# Patient Record
Sex: Female | Born: 1990 | Race: White | Hispanic: No | Marital: Single | Smoking: Never smoker
Health system: Southern US, Community
[De-identification: ages and names within clinical notes are randomized; demographics above are authoritative.]

---
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
## 2008-02-05 HISTORY — PX: OTHER SURGICAL HISTORY: SHX169

## 2009-03-05 IMAGING — CT CT ABDOMEN W/O CM
2 of 4 series · 17 of 46 positions shown, 19 images · non-contrast
Comparison: None

CT ABDOMEN

CLINICAL DATA: Right kidney stone

CT ABDOMEN AND PELVIS WITHOUT CONTRAST
TECHNIQUE: Multidetector CT imaging of the abdomen and pelvis was
performed following the standard
protocol without intravenous contrast.

[Series 2: stone <(id) w/o a & p (id) · axial · non-contrast · 0.63mm/px · z∈[-424,-54]mm · 14 of 82 slices shown, 16 images]
[im 4/82  soft-tissue]
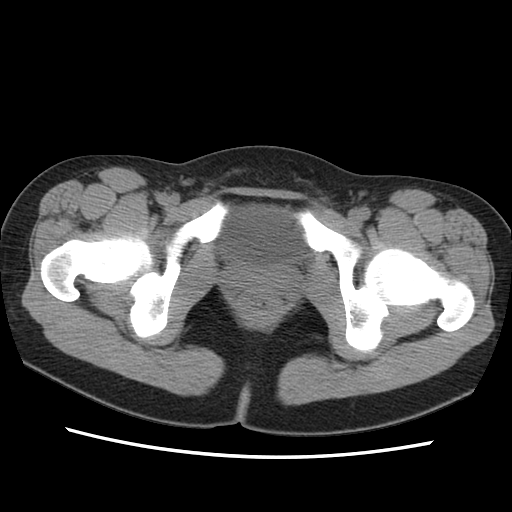
[im 4/82  bone]
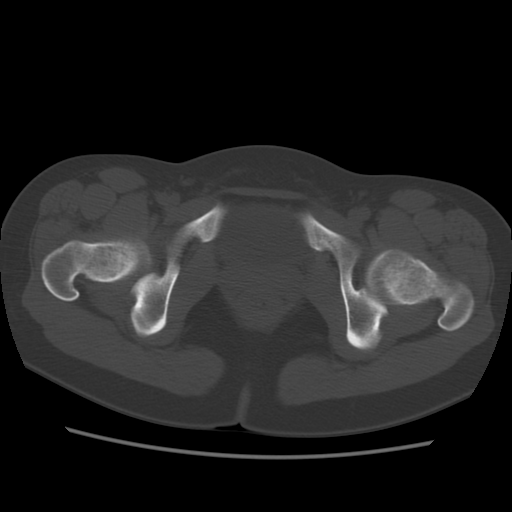
[im 11/82  soft-tissue]
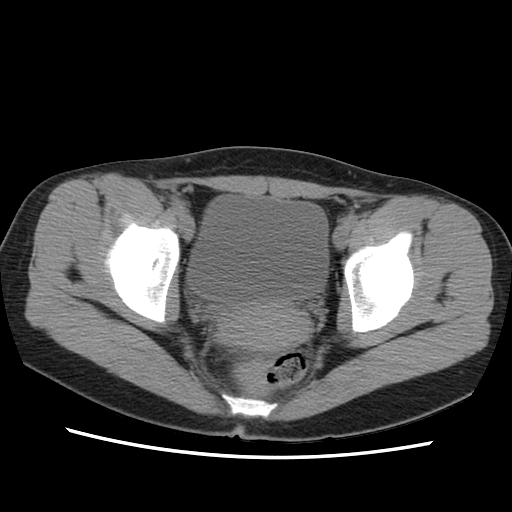
[im 17/82  soft-tissue]
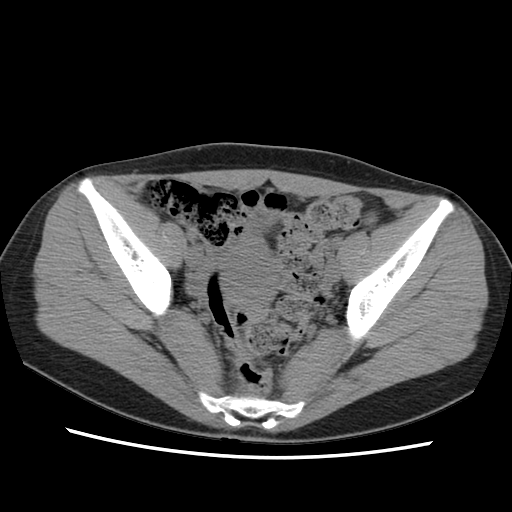
[im 21/82  soft-tissue]
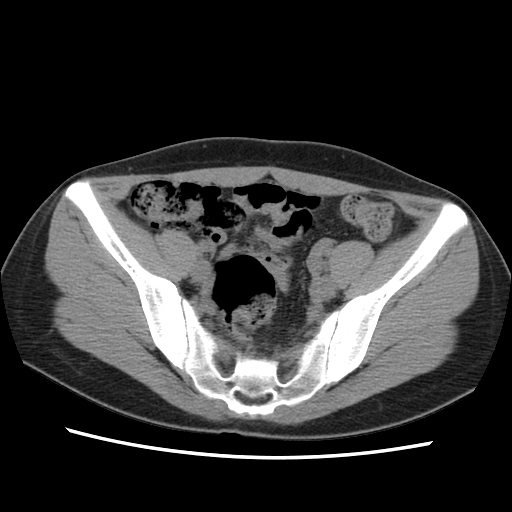
[im 28/82  soft-tissue]
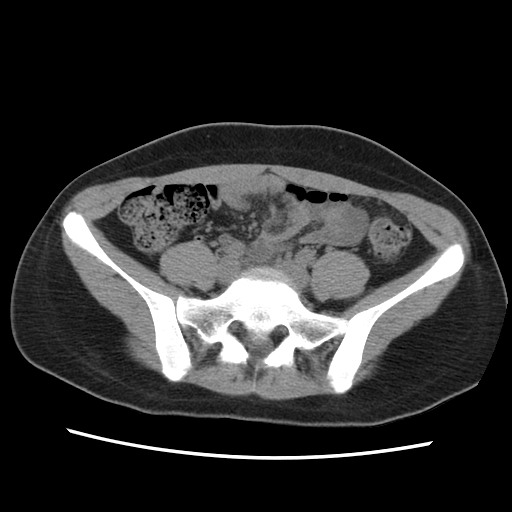
[im 34/82  soft-tissue]
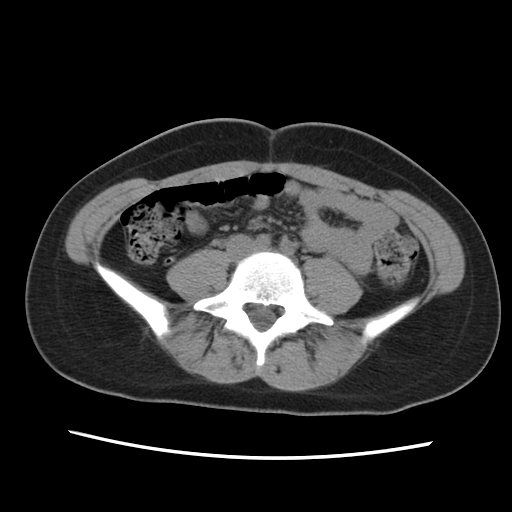
[im 38/82  soft-tissue]
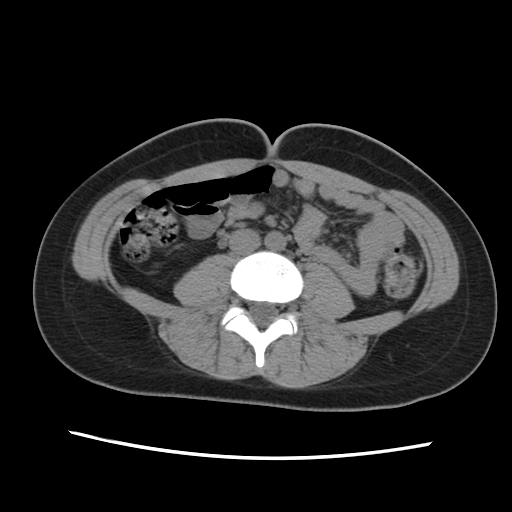
[im 44/82  soft-tissue]
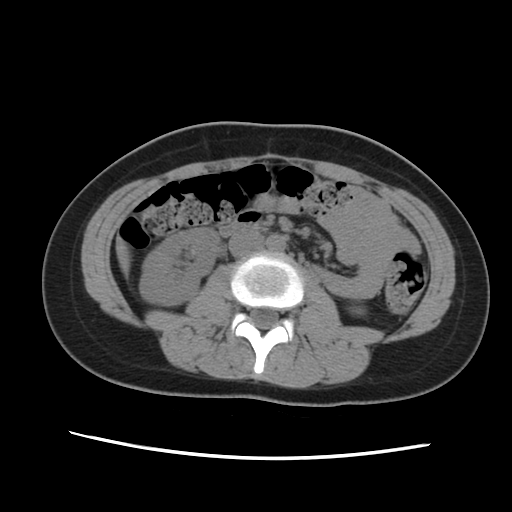
[im 48/82  soft-tissue]
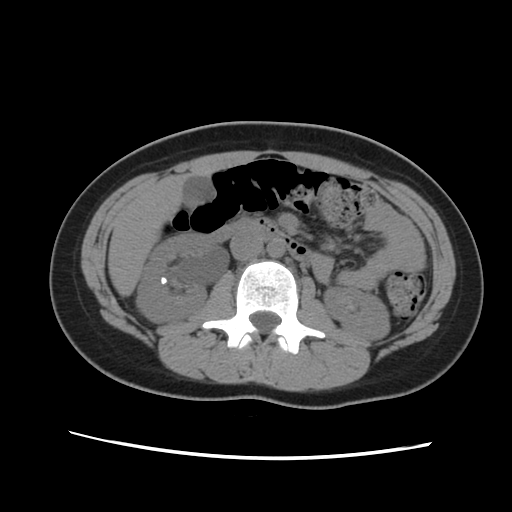
[im 48/82  bone]
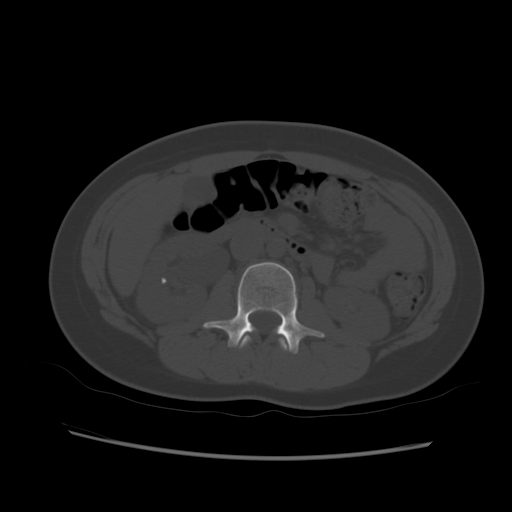
[im 55/82  soft-tissue]
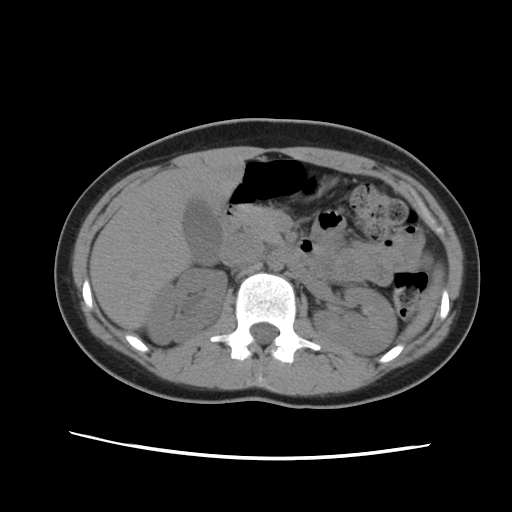
[im 61/82  soft-tissue]
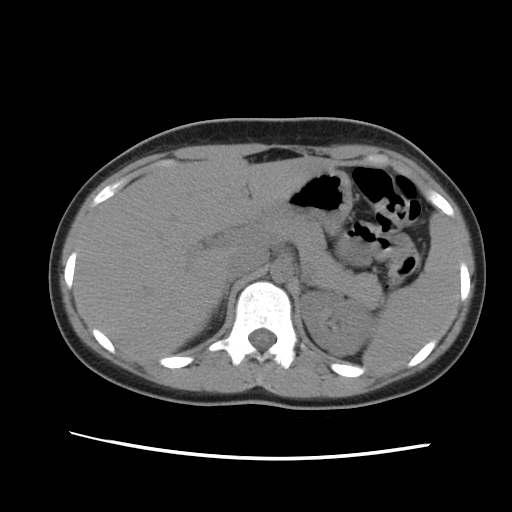
[im 65/82  soft-tissue]
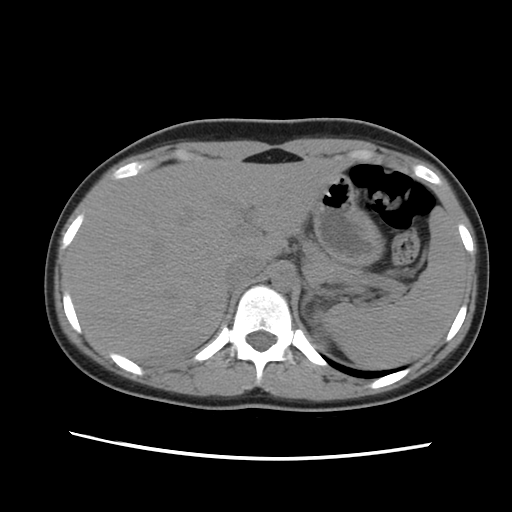
[im 71/82  soft-tissue]
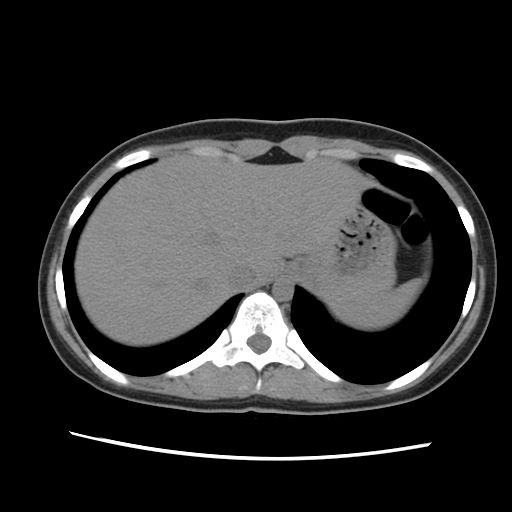
[im 78/82  soft-tissue]
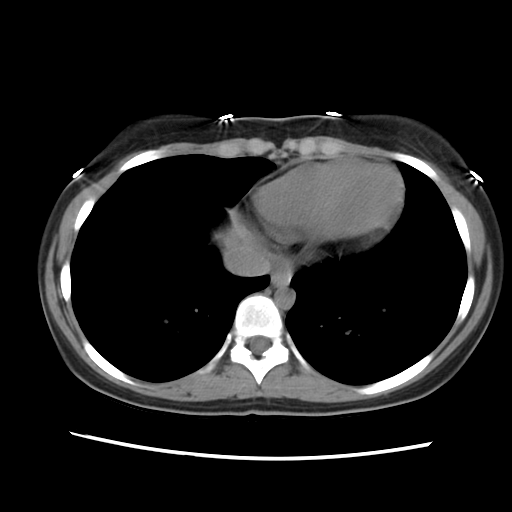

[Series 401: reformatted · coronal · 0.89mm/px · 3 of 55 slices shown]
[im 19/55  soft-tissue]
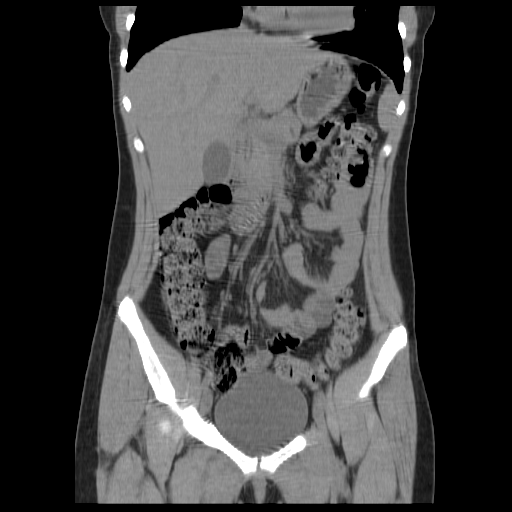
[im 25/55  soft-tissue]
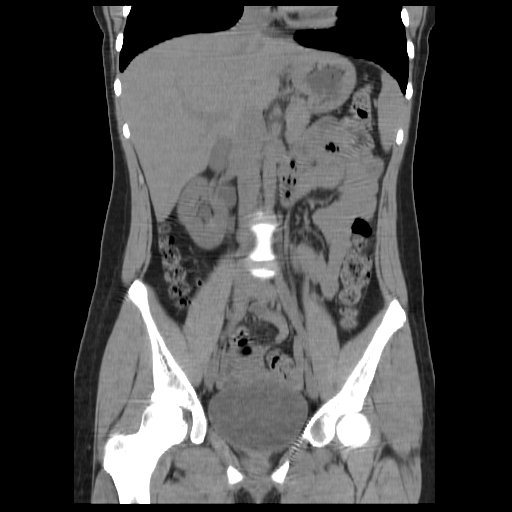
[im 31/55  soft-tissue]
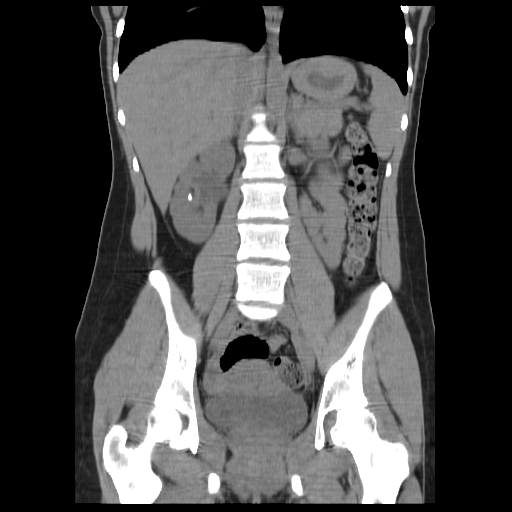

[17 of 46 positions shown; findings below may reference images not displayed]

FINDINGS: The lung bases are clear.

The liver is normal in attenuation and morphology.

Spleen normal.

Adrenal glands normal.

Pancreas is normal.

There are bilateral renal calculi.  There are small punctate stones
in the left renal collecting system.

The largest stone within the right kidney is in the lower pole
measuring 4.6 mm, image 35.

There is asymmetric right-sided hydronephrosis and hydroureter.
Within the distal right ureter there is a 2-3 mm stone at the level
of the UVJ.

Negative for enlarged retroperitoneal or small bowel mesenteric
lymph nodes.

The bowel loops of the upper abdomen are normal in course and
caliber.

There is no free fluid or abnormal fluid collections.

The appendix is normal.

Review of the visualized osseous structures is negative
IMPRESSION: 1.  Distal right ureteral stone measures 2-3 mm.  This results in
moderate right-sided hydronephrosis and hydroureter.
2.  Bilateral renal calculi.

CT PELVIS
FINDINGS: Distal right ureteral stone measures 2-3 mm.

A small amount of free fluid is identified within the pelvis the
amount of free fluid is nonspecific and may be physiologic in a
premenopausal female

The urinary bladder is negative.  The uterus and the adnexal
structures are unremarkable for noncontrast technique

There is no enlarged lymph nodes identified.

Review of the visualized osseous structures is negative
IMPRESSION: 1.  Distal right ureteral stone measures 2-3 mm.

## 2010-06-19 NOTE — Op Note (Signed)
Amy Campbell, Amy Campbell               ACCOUNT NO.:  [id number]   MEDICAL RECORD NO.:  [id number]          PATIENT TYPE:  AMB   LOCATION:  DAY                          FACILITY:  [facility]   PHYSICIAN:  Maretta Bees. Vonita Moss, M.D.DATE OF BIRTH:  09/30/1990   DATE OF PROCEDURE:  09/30/2007  DATE OF DISCHARGE:                               OPERATIVE REPORT   PREOPERATIVE DIAGNOSIS:  A 2 mm distal right ureteral stone.   POSTOPERATIVE DIAGNOSIS:  A 2 mm distal right ureteral stone.   PROCEDURE:  Cystoscopy, right ureteroscopy and stone basketing, right  retrograde pyelogram with interpretation and insertion of right double-J  catheter.   SURGEON:  Clent Ridges, MD   ANESTHESIA:  General.   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  Because of persistent symptoms she is brought to the OR  today.   PROCEDURE IN DETAIL:  The patient is brought to the operating room and  placed in lithotomy position.  External genitalia prepped and draped in  the usual fashion.  Fluoroscopy revealed a 2 mm stone in the vicinity of  the distal right ureter.  After insertion of the cystoscope the bladder  was unremarkable except for a lot of inflammation and swelling at the  right ureteral orifice.  I initially tried to pass a metal guidewire but  it would not go beyond the stone and then I utilized a Glidewire but it  was a very difficult manipulation and I finally had to inject Xylocaine  jelly through an open-ended ureteral catheter finally allowing me to  manipulate the Glidewire above and beyond the stone.  I then inserted an  open-ended ureteral catheter over the Glidewire and replaced the  Glidewire with a metal retractable tip guidewire.  I then tried to pass  a Nitinol basket but it would not go beyond the stone so then I inserted  a 6 French rigid ureteroscope and in the intramural  ureter visualized  this yellow stone which was obviously irregular and stuck to the  ureteral wall.  Fortunately with the Nitinol basket I was now able to  manipulate the stone and retrieve it intact and give it to her family  later on.   Using the cystoscope inserting an open-ended catheter through the  cystoscope I performed right retrograde pyelogram.  She had  pyelocaliectasis and there was no evidence of extravasation but there  was obviously some spasm in the distal ureter and sluggish drainage.  I  decided it was probably best to put in double-J catheter because I felt  she had a lot of edema and swelling from this impacted stone.   After reinsertion of a Glidewire and open-ended catheter and metal  guidewire I inserted a 6 French 26 cm double-J Contour catheter with a  full coil in the upper caliceal system and full coil in the bladder with  the string brought out per urethra.  She was then taken to the recovery  room in  good condition having tolerated the procedure well.      Maretta Bees. Vonita Moss, M.D.  Electronically Signed     LJP/MEDQ  D:  09/30/2007  T:  09/30/2007  Job:  [id number]

## 2013-01-22 ENCOUNTER — Ambulatory Visit (INDEPENDENT_AMBULATORY_CARE_PROVIDER_SITE_OTHER): Payer: BC Managed Care – PPO | Admitting: Women's Health

## 2013-01-22 ENCOUNTER — Encounter: Payer: Self-pay | Admitting: Women's Health

## 2013-01-22 VITALS — BP 116/72 | Ht 64.5 in | Wt 135.2 lb

## 2013-01-22 DIAGNOSIS — Z833 Family history of diabetes mellitus: Secondary | ICD-10-CM

## 2013-01-22 DIAGNOSIS — Z01419 Encounter for gynecological examination (general) (routine) without abnormal findings: Secondary | ICD-10-CM

## 2013-01-22 LAB — CBC WITH DIFFERENTIAL/PLATELET
HCT: 38.5 % (ref 36.0–46.0)
Hemoglobin: 12.9 g/dL (ref 12.0–15.0)
Lymphocytes Relative: 30 % (ref 12–46)
Lymphs Abs: 2.2 10*3/uL (ref 0.7–4.0)
Monocytes Absolute: 0.8 10*3/uL (ref 0.1–1.0)
Monocytes Relative: 11 % (ref 3–12)
Neutro Abs: 4.2 10*3/uL (ref 1.7–7.7)
Neutrophils Relative %: 58 % (ref 43–77)
RBC: 4.6 MIL/uL (ref 3.87–5.11)
WBC: 7.3 10*3/uL (ref 4.0–10.5)

## 2013-01-22 LAB — GLUCOSE, RANDOM: Glucose, Bld: 94 mg/dL (ref 70–99)

## 2013-01-22 NOTE — Patient Instructions (Signed)

## 2013-01-22 NOTE — Progress Notes (Signed)
Amy Campbell 02-Sep-1990 [id number]    History:    The patient presents for annual exam.  Monthly cycle/condoms/same partner with negative STD screen. Had been on pills in the past did not like. Would like IUD. Completed gardasil series. Normal Pap.   Past medical history, past surgical history, family history and social history were all reviewed and documented in the EPIC chart. 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 has just  returned from [facility]. Leaving for [facility] for a job in a few months. Father diabetes.  ROS:  A  ROS was performed and pertinent positives and negatives are included in the history.  Exam:  Filed Vitals:   01/22/13 1530  BP: 116/72    General appearance:  Normal Head/Neck:  Normal, without cervical or supraclavicular adenopathy. Thyroid:  Symmetrical, normal in size, without palpable masses or nodularity. Respiratory  Effort:  Normal  Auscultation:  Clear without wheezing or rhonchi Cardiovascular  Auscultation:  Regular rate, without rubs, murmurs or gallops  Edema/varicosities:  Not grossly evident Abdominal  Soft,nontender, without masses, guarding or rebound.  Liver/spleen:  No organomegaly noted  Hernia:  None appreciated  Skin  Inspection:  Grossly normal  Palpation:  Grossly normal Neurologic/psychiatric  Orientation:  Normal with appropriate conversation.  Mood/affect:  Normal  Genitourinary    Breasts: Examined lying and sitting.     Right: Without masses, retractions, discharge or axillary adenopathy.     Left: Without masses, retractions, discharge or axillary adenopathy.   Inguinal/mons:  Normal without inguinal adenopathy  External genitalia:  Normal  BUS/Urethra/Skene's glands:  Normal  Bladder:  Normal  Vagina:  Normal  Cervix:  Normal  Uterus:   normal in size, shape and contour.  Midline and mobile  Adnexa/parametria:     Rt: Without masses or tenderness.   Lt: Without masses or tenderness.  Anus and  perineum: Normal    Assessment/Plan:  22 y.o. SWF G0 for annual exam with no complaints  Contraception management  Plan: Contraception options reviewed would like to try Mirena IUD. Risks of infection, perforation, hemorrhage given. Will  schedule with Dr. Lily Peer with next cycle.  SBE's, regular exercise, calcium rich diet, MVI daily encouraged. CBC, glucose, Pap.    Harrington Challenger [facility], 4:01 PM 01/22/2013

## 2013-01-23 LAB — URINALYSIS W MICROSCOPIC + REFLEX CULTURE
Casts: NONE SEEN
Glucose, UA: NEGATIVE mg/dL
Hgb urine dipstick: NEGATIVE
Leukocytes, UA: NEGATIVE
Nitrite: NEGATIVE
Squamous Epithelial / LPF: NONE SEEN
pH: 5.5 (ref 5.0–8.0)

## 2013-01-25 ENCOUNTER — Other Ambulatory Visit (HOSPITAL_COMMUNITY)
Admission: RE | Admit: 2013-01-25 | Discharge: 2013-01-25 | Disposition: A | Discharge status: Home / Self Care | Payer: BC Managed Care – PPO | Source: Ambulatory Visit | Attending: Gynecology | Admitting: Gynecology

## 2013-01-25 DIAGNOSIS — Z01419 Encounter for gynecological examination (general) (routine) without abnormal findings: Secondary | ICD-10-CM | POA: Insufficient documentation

## 2013-01-25 NOTE — Addendum Note (Signed)
Addended by: Richardson Chiquito on: 01/25/2013 09:39 AM   Modules accepted: Orders

## 2013-02-05 ENCOUNTER — Telehealth: Payer: Self-pay | Admitting: Gynecology

## 2013-02-05 ENCOUNTER — Other Ambulatory Visit: Payer: Self-pay | Admitting: Gynecology

## 2013-02-05 DIAGNOSIS — Z3049 Encounter for surveillance of other contraceptives: Secondary | ICD-10-CM

## 2013-02-05 MED ORDER — LEVONORGESTREL 20 MCG/24HR IU IUD: INTRAUTERINE_SYSTEM | Freq: Once | INTRAUTERINE | Status: AC

## 2013-02-05 NOTE — Telephone Encounter (Signed)
02/05/13-LM VM that the Mirena is covered at 100%,no copay. Patient is to call first day of cycle to schedule insertion.WL

## 2013-03-05 ENCOUNTER — Encounter: Payer: Self-pay | Admitting: Gynecology

## 2013-03-05 ENCOUNTER — Ambulatory Visit (INDEPENDENT_AMBULATORY_CARE_PROVIDER_SITE_OTHER): Payer: BC Managed Care – PPO | Admitting: Gynecology

## 2013-03-05 VITALS — BP 122/78

## 2013-03-05 DIAGNOSIS — Z30430 Encounter for insertion of intrauterine contraceptive device: Secondary | ICD-10-CM

## 2013-03-05 DIAGNOSIS — Z975 Presence of (intrauterine) contraceptive device: Secondary | ICD-10-CM | POA: Insufficient documentation

## 2013-03-05 NOTE — Progress Notes (Signed)
                                                                     IUD procedure note       Patient presented to the office today for placement of Mirena IUD. The patient had previously been provided with literature information on this method of contraception. The risks benefits and pros and cons were discussed and all her questions were answered. She is fully aware that this form of contraception is 99% effective and is good for 5 years.  Pelvic exam: Bartholin urethra Skene glands: Within normal limits Vagina: No lesions or discharge Cervix: No lesions or discharge Uterus: Anteverted position Adnexa: No masses or tenderness Rectal exam: Not done  The cervix was cleansed with Betadine solution. A single-tooth tenaculum was placed on the anterior cervical lip. The uterus sounded to 7 and one half centimeter. The IUD was shown to the patient and inserted in a sterile fashion. The IUD string was trimmed. The single-tooth tenaculum was removed. Patient was instructed to return back to the office in one month for follow up.       Lot number [id number]

## 2013-03-05 NOTE — Patient Instructions (Signed)
Levonorgestrel intrauterine device (IUD) What is this medicine? LEVONORGESTREL IUD (LEE voe nor jes trel) is a contraceptive (birth control) device. The device is placed inside the uterus by a healthcare professional. It is used to prevent pregnancy and can also be used to treat heavy bleeding that occurs during your period. Depending on the device, it can be used for 3 to 5 years. This medicine may be used for other purposes; ask your health care provider or pharmacist if you have questions. COMMON BRAND NAME(S): Mirena, Skyla What should I tell my health care provider before I take this medicine? They need to know if you have any of these conditions: -abnormal Pap smear -cancer of the breast, uterus, or cervix -diabetes -endometritis -genital or pelvic infection now or in the past -have more than one sexual partner or your partner has more than one partner -heart disease -history of an ectopic or tubal pregnancy -immune system problems -IUD in place -liver disease or tumor -problems with blood clots or take blood-thinners -use intravenous drugs -uterus of unusual shape -vaginal bleeding that has not been explained -an unusual or allergic reaction to levonorgestrel, other hormones, silicone, or polyethylene, medicines, foods, dyes, or preservatives -pregnant or trying to get pregnant -breast-feeding How should I use this medicine? This device is placed inside the uterus by a health care professional. Talk to your pediatrician regarding the use of this medicine in children. Special care may be needed. Overdosage: If you think you have taken too much of this medicine contact a poison control center or emergency room at once. NOTE: This medicine is only for you. Do not share this medicine with others. What if I miss a dose? This does not apply. What may interact with this medicine? Do not take this medicine with any of the following  medications: -amprenavir -bosentan -fosamprenavir This medicine may also interact with the following medications: -aprepitant -barbiturate medicines for inducing sleep or treating seizures -bexarotene -griseofulvin -medicines to treat seizures like carbamazepine, ethotoin, felbamate, oxcarbazepine, phenytoin, topiramate -modafinil -pioglitazone -rifabutin -rifampin -rifapentine -some medicines to treat HIV infection like atazanavir, indinavir, lopinavir, nelfinavir, tipranavir, ritonavir -St. John's wort -warfarin This list may not describe all possible interactions. Give your health care provider a list of all the medicines, herbs, non-prescription drugs, or dietary supplements you use. Also tell them if you smoke, drink alcohol, or use illegal drugs. Some items may interact with your medicine. What should I watch for while using this medicine? Visit your doctor or health care professional for regular check ups. See your doctor if you or your partner has sexual contact with others, becomes HIV positive, or gets a sexual transmitted disease. This product does not protect you against HIV infection (AIDS) or other sexually transmitted diseases. You can check the placement of the IUD yourself by reaching up to the top of your vagina with clean fingers to feel the threads. Do not pull on the threads. It is a good habit to check placement after each menstrual period. Call your doctor right away if you feel more of the IUD than just the threads or if you cannot feel the threads at all. The IUD may come out by itself. You may become pregnant if the device comes out. If you notice that the IUD has come out use a backup birth control method like condoms and call your health care provider. Using tampons will not change the position of the IUD and are okay to use during your period. What side effects may I   notice from receiving this medicine? Side effects that you should report to your doctor or  health care professional as soon as possible: -allergic reactions like skin rash, itching or hives, swelling of the face, lips, or tongue -fever, flu-like symptoms -genital sores -high blood pressure -no menstrual period for 6 weeks during use -pain, swelling, warmth in the leg -pelvic pain or tenderness -severe or sudden headache -signs of pregnancy -stomach cramping -sudden shortness of breath -trouble with balance, talking, or walking -unusual vaginal bleeding, discharge -yellowing of the eyes or skin Side effects that usually do not require medical attention (report to your doctor or health care professional if they continue or are bothersome): -acne -breast pain -change in sex drive or performance -changes in weight -cramping, dizziness, or faintness while the device is being inserted -headache -irregular menstrual bleeding within first 3 to 6 months of use -nausea This list may not describe all possible side effects. Call your doctor for medical advice about side effects. You may report side effects to FDA at 1-800-FDA-1088. Where should I keep my medicine? This does not apply. NOTE: This sheet is a summary. It may not cover all possible information. If you have questions about this medicine, talk to your doctor, pharmacist, or health care provider.  2014, Elsevier/Gold Standard. (2011-02-21 13:54:04)  

## 2013-03-09 ENCOUNTER — Encounter: Payer: Self-pay | Admitting: Gynecology

## 2013-04-02 ENCOUNTER — Encounter: Payer: Self-pay | Admitting: Gynecology

## 2013-04-02 ENCOUNTER — Ambulatory Visit (INDEPENDENT_AMBULATORY_CARE_PROVIDER_SITE_OTHER): Payer: BC Managed Care – PPO | Admitting: Gynecology

## 2013-04-02 VITALS — BP 124/70

## 2013-04-02 DIAGNOSIS — Z30431 Encounter for routine checking of intrauterine contraceptive device: Secondary | ICD-10-CM

## 2013-04-02 NOTE — Progress Notes (Signed)
   23 year old who presented to the office today for one month followup after having placed a Mirena IUD. She is without any complaints today.  Exam: Abdomen: Soft nontender no rebound or guarding Pelvic: The urethra Skene was within normal limits Vagina: No lesions or discharge Cervix: IUD string seen Uterus: Anteverted normal size shape and consistency Adnexa: No palpable mass or tenderness Rectal exam: Not done  Assessment/plan: Patient status post 1 month placement of Mirena IUD doing well. Patient scheduled to return back to the this year for her for annual gynecological exam.
